# Patient Record
Sex: Male | Born: 1985 | Race: White | Hispanic: No | Marital: Married | Smoking: Former smoker
Health system: Southern US, Community
[De-identification: ages and names within clinical notes are randomized; demographics above are authoritative.]

## PROBLEM LIST (undated history)

## (undated) DIAGNOSIS — B019 Varicella without complication: Secondary | ICD-10-CM

## (undated) DIAGNOSIS — J45909 Unspecified asthma, uncomplicated: Secondary | ICD-10-CM

## (undated) HISTORY — DX: Unspecified asthma, uncomplicated: J45.909

## (undated) HISTORY — DX: Varicella without complication: B01.9

---

## 2015-02-03 ENCOUNTER — Ambulatory Visit (INDEPENDENT_AMBULATORY_CARE_PROVIDER_SITE_OTHER)
Admission: RE | Admit: 2015-02-03 | Discharge: 2015-02-03 | Disposition: A | Discharge status: Home / Self Care | Payer: 59 | Source: Ambulatory Visit | Attending: Primary Care | Admitting: Primary Care

## 2015-02-03 ENCOUNTER — Ambulatory Visit (INDEPENDENT_AMBULATORY_CARE_PROVIDER_SITE_OTHER): Payer: 59 | Admitting: Primary Care

## 2015-02-03 ENCOUNTER — Encounter (INDEPENDENT_AMBULATORY_CARE_PROVIDER_SITE_OTHER): Payer: Self-pay

## 2015-02-03 ENCOUNTER — Encounter: Payer: Self-pay | Admitting: Primary Care

## 2015-02-03 VITALS — BP 136/84 | HR 93 | Temp 98.3°F | Ht 72.0 in | Wt 274.8 lb

## 2015-02-03 DIAGNOSIS — F411 Generalized anxiety disorder: Secondary | ICD-10-CM

## 2015-02-03 DIAGNOSIS — R05 Cough: Secondary | ICD-10-CM | POA: Diagnosis not present

## 2015-02-03 DIAGNOSIS — R059 Cough, unspecified: Secondary | ICD-10-CM

## 2015-02-03 DIAGNOSIS — R5383 Other fatigue: Secondary | ICD-10-CM

## 2015-02-03 DIAGNOSIS — J4540 Moderate persistent asthma, uncomplicated: Secondary | ICD-10-CM

## 2015-02-03 MED ORDER — ESCITALOPRAM OXALATE 10 MG PO TABS: ORAL_TABLET | ORAL | Status: AC

## 2015-02-03 MED ORDER — BUDESONIDE-FORMOTEROL FUMARATE 160-4.5 MCG/ACT IN AERO
2.0000 | INHALATION_SPRAY | Freq: Two times a day (BID) | RESPIRATORY_TRACT | Status: DC
Start: 2015-02-03 — End: 2016-02-07

## 2015-02-03 MED ORDER — ALBUTEROL SULFATE HFA 108 (90 BASE) MCG/ACT IN AERS: 2.0000 | INHALATION_SPRAY | Freq: Four times a day (QID) | RESPIRATORY_TRACT | Status: AC | PRN

## 2015-02-03 NOTE — Progress Notes (Signed)
Pre visit review using our clinic review tool, if applicable. No additional management support is needed unless otherwise documented below in the visit note. 

## 2015-02-03 NOTE — Patient Instructions (Addendum)
Complete xray(s) prior to leaving today. I will contact you with your results. You will be contacted regarding your referral to pulmonology for lung testing. I have re-ordered your Symbicort inhaler. 2 puffs twice daily. I've also ordered an Albuterol inhaler as needed for wheezing or shortness of breath. 2 puffs every 6 hours as needed.  Start Lexapro for anxiety. Take 1/2 tablet by mouth daily for 7 days, then advance to one full tablet thereafter. Follow up in 4 weeks to re-evaluate your anxiety and asthma. It was a pleasure to meet you today! Please don't hesitate to call me with any questions. Welcome to [facility]!

## 2015-02-03 NOTE — Progress Notes (Signed)
Subjective:    Patient ID: Cody Ford, male    DOB: 08-12-1986, 29 y.o.   MRN: [id number]  HPI  Cody Ford is a 29 year old male who presents today to establish care and discuss the problems mentioned below. Will obtain old records.  1) Asthma: Diagnosed at age 89, improved at age 89. He's had no symptoms of shortness of breath, wheezing, cough until 6 years ago. He did smoke within that time frame and quit 1 year ago. Current symptoms include chest tightness, shortness of breath, wheezing. He's been attempting to get into shape and about 20 minutes into his workouts will experience shortness of breath and wheezing to where he cannot continue. He does not have an albuterol inhaler nor has he for several years. He has been taking Symbicort 160-4.5 twice daily for the past 4 years, but has been running low and trying to preserve his supply for the past few weeks. He's tried Advair without relief and states he's had the best luck with the Symbicort. He admits to missing a few doses lately and will experience wheezing during the day with shortness of breath. He does not have nighttime awakenings, does have daily dry, cough, has moderate limitation with daily activities, especially exercise. Has seasonal allergies is not taking anything OTC. Last PFT's were 2 years ago.   2) Cough/Sore throat: Symptoms began last Friday with a sore throat, swollen tonsils, body aches , left ear pain for 2 days. On day 3 he began feeling worse, "throat on fire", tonsils decreased in size, but developed a fever of 102 with rhinorrhea and congestion. Today he's starting to feel better overall.   3) Anxiety: He reports over the past 6+ months, difficulty concentrating, daydreams a lot about worst case scenario and has difficulty controlling his worrying, has difficulty sleeping most nights, feels very fatigued, doesn't interfere with daily activities. Feels like he's being watched at the gym. Denies SI/HI.  Review of Systems    Constitutional: Positive for fever and chills.  HENT: Positive for congestion and rhinorrhea.   Respiratory: Positive for cough, chest tightness and shortness of breath.   Cardiovascular: Negative for chest pain.  Gastrointestinal: Positive for nausea. Negative for vomiting.  Genitourinary: Negative for dysuria and frequency.  Skin: Negative for rash.  Allergic/Immunologic:       Seasonal allergies  Neurological: Negative for dizziness and headaches.  Hematological: Positive for adenopathy.  Psychiatric/Behavioral: Positive for sleep disturbance and decreased concentration. Negative for suicidal ideas. The patient is nervous/anxious.        Denies concerns for depression.       Past Medical History  Diagnosis Date  . Asthma   . Chicken pox     History   Social History  . Marital Status: Married    Spouse Name: N/A  . Number of Children: N/A  . Years of Education: N/A   Occupational History  . Not on file.   Social History Main Topics  . Smoking status: Former Games developermoker  . Smokeless tobacco: Not on file  . Alcohol Use: 0.0 oz/week    0 Standard drinks or equivalent per week     Comment: occ  . Drug Use: Not on file  . Sexual Activity: Not on file   Other Topics Concern  . Not on file   Social History Narrative   Married.   Has a son.   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   Enjoys exercising when he can.    History reviewed.  No pertinent past surgical history.  Family History  Problem Relation Age of Onset  . Diabetes Father   . Cancer Maternal Grandmother   . Cancer Maternal Grandfather     No Known Allergies  No current outpatient prescriptions on file prior to visit.   No current facility-administered medications on file prior to visit.    BP 136/84 mmHg  Pulse 93  Temp(Src) 98.3 F (36.8 C) (Oral)  Ht 6' (1.829 m)  Wt 274 lb 12.8 oz (124.648 kg)  BMI 37.26 kg/m2  SpO2 94%    Objective:   Physical Exam  Constitutional: He is oriented  to person, place, and time. He appears well-developed.  He is in no distress.  HENT:  Right Ear: External ear normal.  Left Ear: External ear normal.  Nose: Nose normal.  Mouth/Throat: Oropharynx is clear and moist.  Eyes: Conjunctivae are normal. Pupils are equal, round, and reactive to light.  Neck: Neck supple.  Cardiovascular: Normal rate, regular rhythm and normal heart sounds.   Pulmonary/Chest: Effort normal. He has wheezes. He has rales.  Abdominal: Soft. Bowel sounds are normal. There is no tenderness.  Musculoskeletal: Normal range of motion.  Lymphadenopathy:    He has no cervical adenopathy.  Neurological: He is alert and oriented to person, place, and time.  Skin: Skin is warm and dry.  Psychiatric: He has a normal mood and affect.          Assessment & Plan:  Cough:  Afebrile and stable in office today. Wheezing with rhonchi noted upon exam without respiratory distress. Will obtain xray to rule out pneumonia. I do suspect this may be viral as he is improving and exhibits no systemic s/s of bacterial involvement.

## 2015-02-04 ENCOUNTER — Telehealth: Payer: Self-pay | Admitting: Primary Care

## 2015-02-04 ENCOUNTER — Telehealth: Payer: Self-pay | Admitting: *Deleted

## 2015-02-04 DIAGNOSIS — R5383 Other fatigue: Secondary | ICD-10-CM | POA: Insufficient documentation

## 2015-02-04 DIAGNOSIS — J4541 Moderate persistent asthma with (acute) exacerbation: Secondary | ICD-10-CM

## 2015-02-04 DIAGNOSIS — J45909 Unspecified asthma, uncomplicated: Secondary | ICD-10-CM | POA: Insufficient documentation

## 2015-02-04 DIAGNOSIS — F411 Generalized anxiety disorder: Secondary | ICD-10-CM | POA: Insufficient documentation

## 2015-02-04 MED ORDER — PREDNISONE 20 MG PO TABS: ORAL_TABLET | ORAL | Status: AC

## 2015-02-04 MED ORDER — FLUTICASONE PROPIONATE HFA 220 MCG/ACT IN AERO: 1.0000 | INHALATION_SPRAY | Freq: Two times a day (BID) | RESPIRATORY_TRACT | Status: AC

## 2015-02-04 NOTE — Telephone Encounter (Signed)
Please advise 

## 2015-02-04 NOTE — Telephone Encounter (Signed)
Left a voicemail for a call back

## 2015-02-04 NOTE — Telephone Encounter (Signed)
Cancel prior authorization. Mayra ReelKate Clark sent in a different Rx for patient.

## 2015-02-04 NOTE — Telephone Encounter (Signed)
Spoke with patient. His Symbicort is not covered by his insurance and is requesting a different inhaler. Called in medium dose ICS, Floven , 1 puff twice daily. Also called in a short burst of Prednisone for 6 days to help him overcome this exacerbation. He verbalized understanding of the new plan and is to call if no improvement or difficulty with his insurance.

## 2015-02-04 NOTE — Assessment & Plan Note (Signed)
Moderate persistent. Refilled Symbicort and added an albuterol inhaler for rescue and exercise. Referral made to pulmonology for PTF's. Some wheezing in office today, but he's in no distress and is overcoming an infection which is improving.

## 2015-02-04 NOTE — Telephone Encounter (Signed)
Received [place] request from [facility] [place]. Request for prior authorization for Symbicort 160-4.5 MCG inhaler.  Sent to Plan. Prior authorization on 02/04/15. Request ID [id number] Form [facility] electronic pa form

## 2015-02-04 NOTE — Assessment & Plan Note (Signed)
Likely due to anxiety; however, will rule out metabolic causes. TSH, CBC, and BMP to get this week. Will continue to monitor.

## 2015-02-04 NOTE — Telephone Encounter (Signed)
Patient called back. Notified patient of Kate's comments. Lab appt is schedule for 02/05/15. Patient verbalized understanding.

## 2015-02-04 NOTE — Telephone Encounter (Signed)
Pt is calling to find out results of x-rays from yesterday. Please return call to [phone]. Thanks.

## 2015-02-04 NOTE — Telephone Encounter (Signed)
Called patient notify him of his xrays, no answer. I left a message for him to call me back.

## 2015-02-04 NOTE — Telephone Encounter (Signed)
Will you please notify Mr. Cody Ford that I would like to get some blood work to rule out any metabolic problems for his fatigue? I've ordered a TSH (to check his thyroid function), CBC (to rule out anemia), and a BMP to check his electrolytes. He may come at his convenience sometime before the weekend and does not have to see me. Will you help him schedule a lab appointment?  Thanks!

## 2015-02-04 NOTE — Assessment & Plan Note (Signed)
Qualifies for a diagnosis of GAD based upon the DSM-5 diagnostic criteria. Started Lexapro 10, 5mg  for one week then progress up to 10. Denies SI/HI. I've explained to him that drugs of the SSRI class can have side effects such as weight gain, sexual dysfunction, insomnia, headache, nausea. These medications are generally effective at alleviating symptoms of anxiety and/or depression. Also explained that this drug may cause suicidal thinking and to be aware. If he has any suicidal thoughts he understands to present to the emergency department immediately.  Follow up in 4 weeks for re-evaluation.

## 2015-02-05 ENCOUNTER — Other Ambulatory Visit: Payer: 59

## 2015-02-05 NOTE — Telephone Encounter (Signed)
Recevied denial letter from [employer]. PA is not needed.

## 2015-03-03 ENCOUNTER — Ambulatory Visit: Payer: 59 | Admitting: Primary Care

## 2015-03-03 ENCOUNTER — Telehealth: Payer: Self-pay | Admitting: Primary Care

## 2015-03-03 DIAGNOSIS — Z0289 Encounter for other administrative examinations: Secondary | ICD-10-CM

## 2015-03-03 NOTE — Telephone Encounter (Signed)
Will you call Mr. Cody Ford tomorrow regarding his missed appointment and have him rescheduled in the next 1-2 weeks? Also, how is he doing on the new inhaler?  Thank you!

## 2015-03-04 NOTE — Telephone Encounter (Signed)
Left a voice mail for patient to call back.

## 2015-03-05 ENCOUNTER — Encounter: Payer: Self-pay | Admitting: *Deleted

## 2015-03-05 NOTE — Telephone Encounter (Signed)
Left a voice mail for patient to call back.

## 2015-03-05 NOTE — Telephone Encounter (Signed)
A letter is sent to patient.

## 2016-02-07 ENCOUNTER — Other Ambulatory Visit: Payer: Self-pay | Admitting: Primary Care

## 2016-02-07 NOTE — Telephone Encounter (Signed)
Electronically refill request for   budesonide-formoterol (SYMBICORT) 160-4.5 MCG/ACT inhaler   Inhale 2 puffs into the lungs 2 (two) times daily.  Dispense: 1 Inhaler   Refills: 5     Last prescribed and seen on 02/03/2015. No future appointment.  Notes from pharmacy: PT RECENTLY MOVED AND HAS NO PCP YET. IS REQUESTING FILL UNTIL THEN.

## 2016-02-08 MED ORDER — BUDESONIDE-FORMOTEROL FUMARATE 160-4.5 MCG/ACT IN AERO: INHALATION_SPRAY | RESPIRATORY_TRACT | Status: AC

## 2016-02-08 NOTE — Addendum Note (Signed)
Addended by: Tawnya CrookSAMBATH, Nafisah Runions on: 02/08/2016 08:35 AM   Modules accepted: Orders

## 2016-07-16 IMAGING — CR DG CHEST 2V
2 series · 2 of 2 positions shown · non-contrast
Comparison: None.

CLINICAL DATA: Rhonchi , diminished breath sounds in left lower
lobe

EXAM:
CHEST  2 VIEW

[view not recorded (1 of 2)]
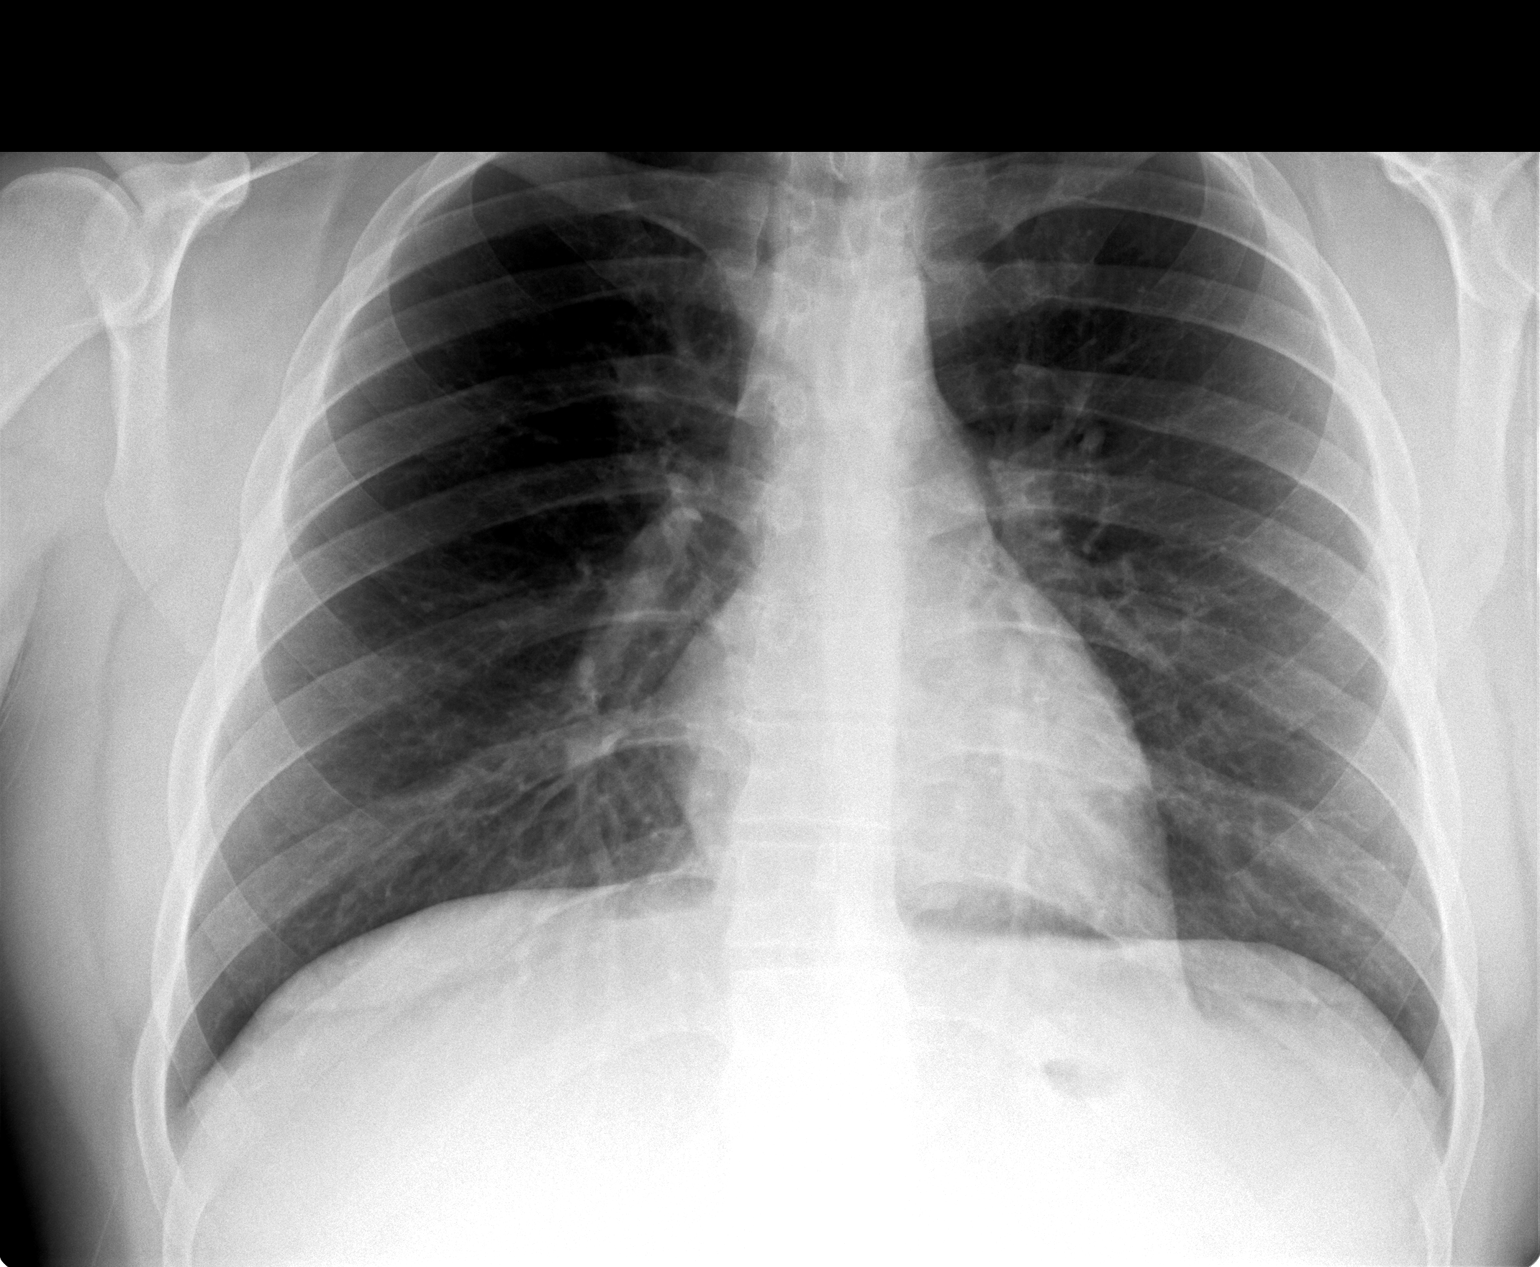

[view not recorded (2 of 2)]
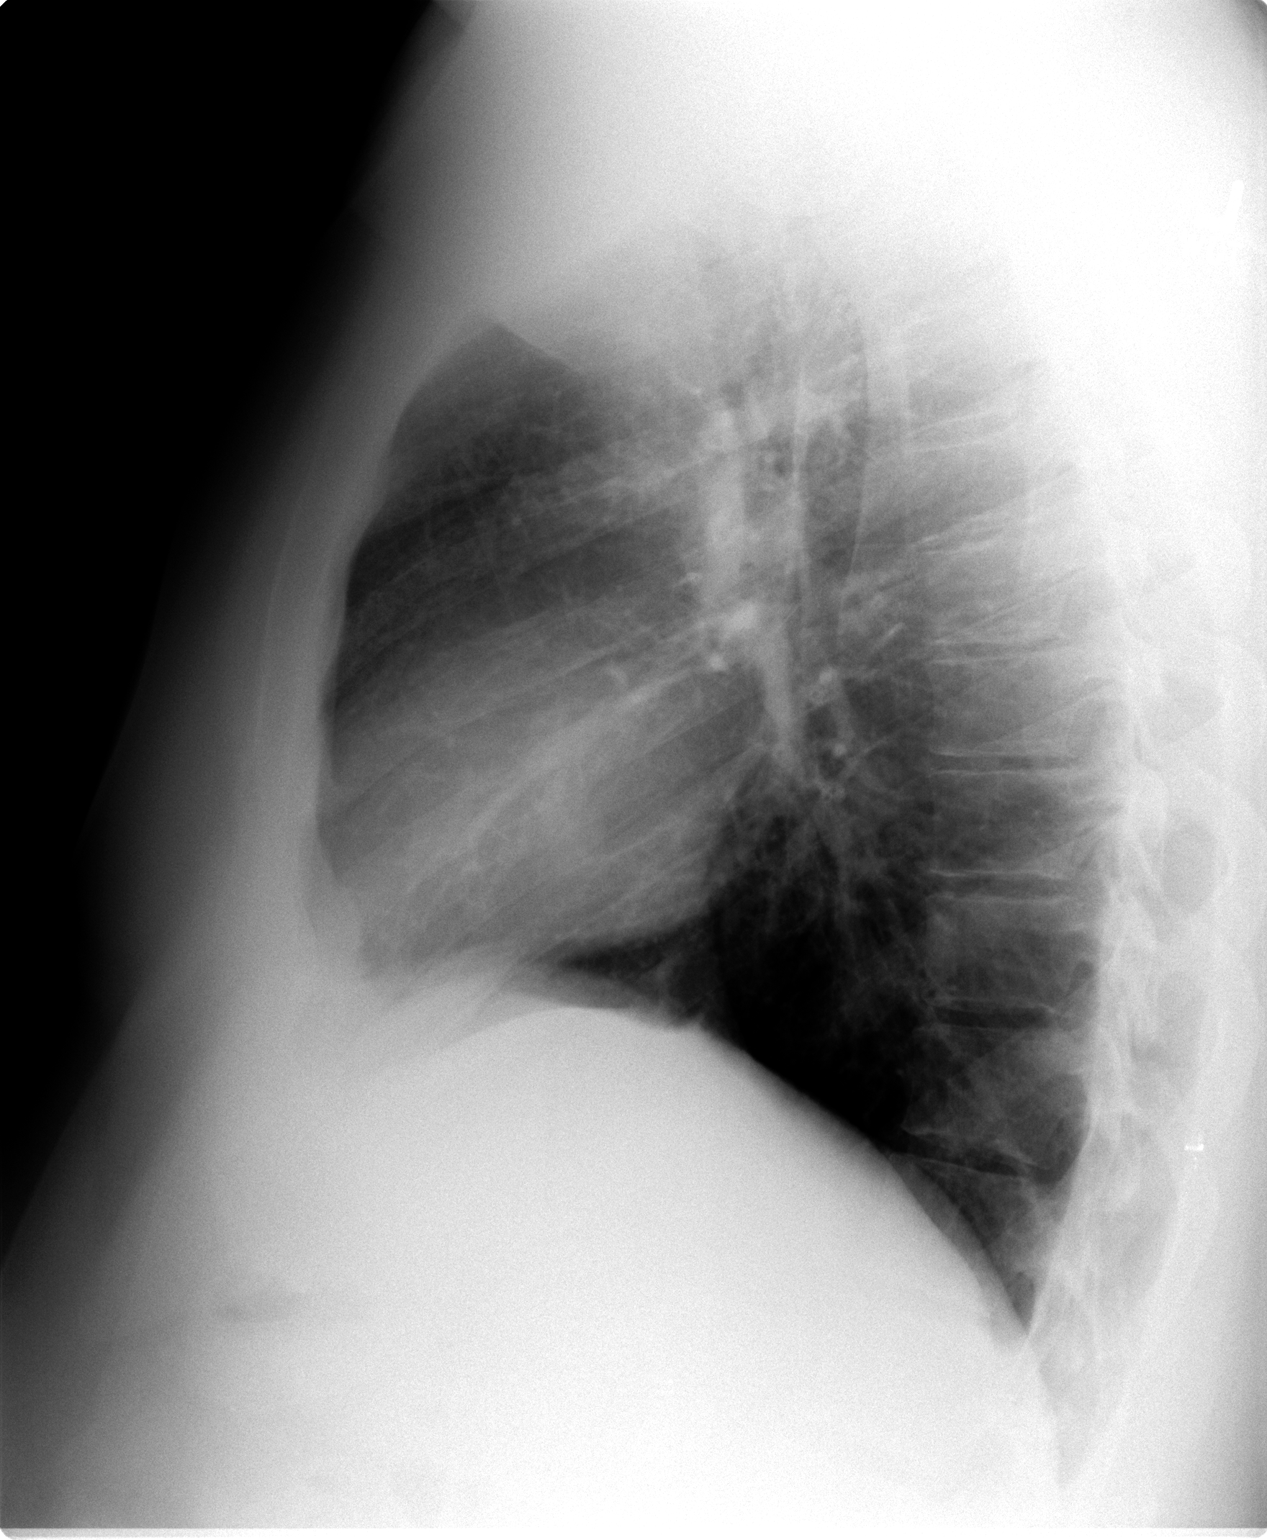

[2 of 2 positions shown; findings below may reference images not displayed]

FINDINGS: The heart size and mediastinal contours are within normal limits.
Both lungs are clear. The visualized skeletal structures are
unremarkable.
IMPRESSION: No active cardiopulmonary disease.

## 2016-07-18 ENCOUNTER — Other Ambulatory Visit: Payer: Self-pay | Admitting: Primary Care
